# Patient Record
Sex: Female | Born: 1970
Health system: Southern US, Community
[De-identification: ages and names within clinical notes are randomized; demographics above are authoritative.]

## PROBLEM LIST (undated history)

## (undated) DIAGNOSIS — I10 Essential (primary) hypertension: Secondary | ICD-10-CM

## (undated) DIAGNOSIS — D219 Benign neoplasm of connective and other soft tissue, unspecified: Secondary | ICD-10-CM

## (undated) HISTORY — DX: Benign neoplasm of connective and other soft tissue, unspecified: D21.9

## (undated) HISTORY — PX: TUBAL LIGATION: SHX77

## (undated) HISTORY — DX: Essential (primary) hypertension: I10

---

## 2006-10-03 ENCOUNTER — Other Ambulatory Visit: Admission: RE | Admit: 2006-10-03 | Discharge: 2006-10-03 | Payer: Self-pay | Admitting: Obstetrics and Gynecology

## 2008-02-17 ENCOUNTER — Other Ambulatory Visit: Admission: RE | Admit: 2008-02-17 | Discharge: 2008-02-17 | Payer: Self-pay | Admitting: Obstetrics and Gynecology

## 2014-08-19 ENCOUNTER — Ambulatory Visit: Payer: Self-pay

## 2014-12-01 ENCOUNTER — Other Ambulatory Visit: Payer: Self-pay | Admitting: Family Medicine

## 2014-12-01 DIAGNOSIS — Z1231 Encounter for screening mammogram for malignant neoplasm of breast: Secondary | ICD-10-CM

## 2014-12-15 ENCOUNTER — Ambulatory Visit
Admission: RE | Admit: 2014-12-15 | Discharge: 2014-12-15 | Disposition: A | Discharge status: Home / Self Care | Payer: 59 | Source: Ambulatory Visit | Attending: Family Medicine | Admitting: Family Medicine

## 2014-12-15 DIAGNOSIS — Z1231 Encounter for screening mammogram for malignant neoplasm of breast: Secondary | ICD-10-CM

## 2015-11-22 ENCOUNTER — Other Ambulatory Visit: Payer: Self-pay | Admitting: Family Medicine

## 2015-11-22 DIAGNOSIS — Z1231 Encounter for screening mammogram for malignant neoplasm of breast: Secondary | ICD-10-CM

## 2015-12-16 ENCOUNTER — Ambulatory Visit: Payer: 59

## 2015-12-28 ENCOUNTER — Encounter: Payer: Self-pay | Admitting: Radiology

## 2015-12-28 ENCOUNTER — Ambulatory Visit
Admission: RE | Admit: 2015-12-28 | Discharge: 2015-12-28 | Disposition: A | Discharge status: Home / Self Care | Payer: 59 | Source: Ambulatory Visit | Attending: Family Medicine | Admitting: Family Medicine

## 2015-12-28 DIAGNOSIS — Z1231 Encounter for screening mammogram for malignant neoplasm of breast: Secondary | ICD-10-CM

## 2017-01-16 ENCOUNTER — Other Ambulatory Visit: Payer: Self-pay | Admitting: Family Medicine

## 2017-01-16 DIAGNOSIS — Z1231 Encounter for screening mammogram for malignant neoplasm of breast: Secondary | ICD-10-CM

## 2017-02-08 ENCOUNTER — Ambulatory Visit
Admission: RE | Admit: 2017-02-08 | Discharge: 2017-02-08 | Disposition: A | Discharge status: Home / Self Care | Payer: 59 | Source: Ambulatory Visit | Attending: Family Medicine | Admitting: Family Medicine

## 2017-02-08 DIAGNOSIS — Z1231 Encounter for screening mammogram for malignant neoplasm of breast: Secondary | ICD-10-CM

## 2017-02-22 ENCOUNTER — Ambulatory Visit: Payer: 59

## 2019-01-14 ENCOUNTER — Encounter (HOSPITAL_COMMUNITY): Payer: Self-pay | Admitting: *Deleted

## 2019-01-14 ENCOUNTER — Other Ambulatory Visit: Payer: Self-pay

## 2019-01-14 DIAGNOSIS — R0600 Dyspnea, unspecified: Secondary | ICD-10-CM | POA: Insufficient documentation

## 2019-01-14 DIAGNOSIS — U071 COVID-19: Secondary | ICD-10-CM | POA: Insufficient documentation

## 2019-01-14 DIAGNOSIS — R05 Cough: Secondary | ICD-10-CM | POA: Insufficient documentation

## 2019-01-14 NOTE — ED Triage Notes (Signed)
Pt states that she was diagnosed as having covid on 01/05/2019, reports that she continues to have sob, cough, fever, sore throat, loss of taste and smell, denies any n/v/d

## 2019-01-15 ENCOUNTER — Emergency Department (HOSPITAL_COMMUNITY)
Admission: EM | Admit: 2019-01-15 | Discharge: 2019-01-15 | Disposition: A | Discharge status: Home / Self Care | Payer: Self-pay | Attending: Emergency Medicine | Admitting: Emergency Medicine

## 2019-01-15 ENCOUNTER — Emergency Department (HOSPITAL_COMMUNITY): Payer: Self-pay

## 2019-01-15 DIAGNOSIS — U071 COVID-19: Secondary | ICD-10-CM

## 2019-01-15 LAB — COMPREHENSIVE METABOLIC PANEL
ALT: 19 U/L (ref 0–44)
AST: 20 U/L (ref 15–41)
Albumin: 3.4 g/dL — ABNORMAL LOW (ref 3.5–5.0)
Alkaline Phosphatase: 84 U/L (ref 38–126)
Anion gap: 7 (ref 5–15)
BUN: 9 mg/dL (ref 6–20)
CO2: 26 mmol/L (ref 22–32)
Calcium: 8 mg/dL — ABNORMAL LOW (ref 8.9–10.3)
Chloride: 102 mmol/L (ref 98–111)
Creatinine, Ser: 0.76 mg/dL (ref 0.44–1.00)
GFR calc Af Amer: >60 mL/min (ref >60)
GFR calc non Af Amer: >60 mL/min (ref >60)
Glucose, Bld: 105 mg/dL — ABNORMAL HIGH (ref 70–99)
Potassium: 3.7 mmol/L (ref 3.5–5.1)
Sodium: 135 mmol/L (ref 135–145)
Total Bilirubin: 0.6 mg/dL (ref 0.3–1.2)
Total Protein: 7.9 g/dL (ref 6.5–8.1)

## 2019-01-15 LAB — CBC WITH DIFFERENTIAL/PLATELET
Abs Immature Granulocytes: 0.01 10*3/uL (ref 0.00–0.07)
Basophils Absolute: 0 10*3/uL (ref 0.0–0.1)
Basophils Relative: 0 %
Eosinophils Absolute: 0 10*3/uL (ref 0.0–0.5)
Eosinophils Relative: 0 %
HCT: 41.6 % (ref 36.0–46.0)
Hemoglobin: 12.3 g/dL (ref 12.0–15.0)
Immature Granulocytes: 0 %
Lymphocytes Relative: 34 %
Lymphs Abs: 1.8 10*3/uL (ref 0.7–4.0)
MCH: 23.3 pg — ABNORMAL LOW (ref 26.0–34.0)
MCHC: 29.6 g/dL — ABNORMAL LOW (ref 30.0–36.0)
MCV: 78.6 fL — ABNORMAL LOW (ref 80.0–100.0)
Monocytes Absolute: 0.5 10*3/uL (ref 0.1–1.0)
Monocytes Relative: 10 %
Neutro Abs: 2.9 10*3/uL (ref 1.7–7.7)
Neutrophils Relative %: 56 %
Platelets: 305 10*3/uL (ref 150–400)
RBC: 5.29 MIL/uL — ABNORMAL HIGH (ref 3.87–5.11)
RDW: 17.2 % — ABNORMAL HIGH (ref 11.5–15.5)
WBC: 5.2 10*3/uL (ref 4.0–10.5)
nRBC: 0 % (ref 0.0–0.2)

## 2019-01-15 NOTE — ED Notes (Signed)
Ambulated pt on room air and o2 stayed at 92%

## 2019-01-15 NOTE — Discharge Instructions (Addendum)
Return if your breathing is getting worse/. 

## 2019-01-15 NOTE — ED Provider Notes (Signed)
[facility] EMERGENCY DEPARTMENT Provider Note   CSN: [id number] Arrival date & time: 01/14/19  2121    History   Chief Complaint Chief Complaint  Patient presents with   Cough    HPI Jeanne Guerrero is a 48 y.o. female.   The history is provided by the patient.  Cough She was diagnosed as Covid positive on November 23, symptoms started on November 22.  She has had a nonproductive cough, fever, chills.  She has lost her sense of smell and taste.  She initially had some diarrhea, but is no longer having diarrhea.  She denies nausea or vomiting.  She denies body aches.  She has noted that she is getting more dyspneic with dyspnea being mainly with moderate exertion.  She has been taking vitamin C, vitamin D, ibuprofen at home.  History reviewed. No pertinent past medical history.  There are no active problems to display for this patient.   Past Surgical History:  Procedure Laterality Date   CESAREAN SECTION       OB History   No obstetric history on file.      Home Medications    Prior to Admission medications   Not on File    Family History Family History  Problem Relation Age of Onset   Breast cancer Maternal Grandmother     Social History Social History   Tobacco Use   Smoking status: Former Smoker   Smokeless tobacco: Never Used  Substance Use Topics   Alcohol use: Not Currently   Drug use: Not on file     Allergies   Moxifloxacin   Review of Systems Review of Systems  Respiratory: Positive for cough.   All other systems reviewed and are negative.    Physical Exam Updated Vital Signs BP (!) 147/104    Pulse (!) 102    Temp (!) 100.4 F (38 C) (Oral)    Resp 20    Ht 5' 8.5" (1.74 m)    Wt 124.5 kg    LMP 12/31/2018    SpO2 96%    BMI 41.12 kg/m   Physical Exam Vitals signs and nursing note reviewed.    47 year old female, resting comfortably and in no acute distress. Vital signs are significant for elevated temperature,  heart rate, blood pressure. Oxygen saturation is 92%, which is normal. Head is normocephalic and atraumatic. PERRLA, EOMI. Oropharynx is clear. Neck is nontender and supple without adenopathy or JVD. Back is nontender and there is no CVA tenderness. Lungs are clear without rales, wheezes, or rhonchi. Chest is nontender. Heart has regular rate and rhythm without murmur. Abdomen is soft, flat, nontender without masses or hepatosplenomegaly and peristalsis is normoactive. Extremities have no cyanosis or edema, full range of motion is present. Skin is warm and dry without rash. Neurologic: Mental status is normal, cranial nerves are intact, there are no motor or sensory deficits.  ED Treatments / Results  Labs (all labs ordered are listed, but only abnormal results are displayed) Labs Reviewed  CBC WITH DIFFERENTIAL/PLATELET - Abnormal; Notable for the following components:      Result Value   RBC 5.29 (*)    MCV 78.6 (*)    MCH 23.3 (*)    MCHC 29.6 (*)    RDW 17.2 (*)    All other components within normal limits  COMPREHENSIVE METABOLIC PANEL - Abnormal; Notable for the following components:   Glucose, Bld 105 (*)    Calcium 8.0 (*)    Albumin  3.4 (*)    All other components within normal limits    Radiology Dg Chest Port 1 View  Result Date: 01/15/2019 CLINICAL DATA:  Fever and covid positive EXAM: PORTABLE CHEST 1 VIEW COMPARISON:  None. FINDINGS: The heart size and mediastinal contours are within normal limits. Patchy airspace opacities seen at both lower lungs. There is overall shallow degree of aeration. No pleural effusion. No acute osseous abnormality. IMPRESSION: Patchy airspace opacities seen at both lower lungs. The findings in the lungs are nonspecific, but concerning for atypical infection, which includes viral pneumonia (COVID-19). Electronically Signed   By: Prudencio Pair M.D.   On: 01/15/2019 01:15    Procedures Procedures  Medications Ordered in ED Medications -  No data to display   Initial Impression / Assessment and Plan / ED Course  I have reviewed the triage vital signs and the nursing notes.  Pertinent labs & imaging results that were available during my care of the patient were reviewed by me and considered in my medical decision making (see chart for details).  COVID-19 infection with persistent fever and worsening dyspnea.  Will check chest x-ray, screening labs.  Pulse oximetry on room air is normal, will check pulse oximetry with ambulation.  Old records are reviewed, and she has no relevant past visits.  Labs are unremarkable.  Chest x-ray does show some minimal bilateral infiltrates.  On ambulation, patient maintain adequate oxygen saturation.  No indication for hospital admission at this point.  This was explained to the patient.  She is discharged to continue symptomatic treatment at home, but advised to return if dyspnea seems to be worsening.  [name] was evaluated in Emergency Department on 01/15/2019 for the symptoms described in the history of present illness. She was evaluated in the context of the global COVID-19 pandemic, which necessitated consideration that the patient might be at risk for infection with the SARS-CoV-2 virus that causes COVID-19. Institutional protocols and algorithms that pertain to the evaluation of patients at risk for COVID-19 are in a state of rapid change based on information released by regulatory bodies including the CDC and federal and state organizations. These policies and algorithms were followed during the patient's care in the ED.  Final Clinical Impressions(s) / ED Diagnoses   Final diagnoses:  COVID-19 virus infection    ED Discharge Orders    None       Delora Fuel, MD [id number] [phone]

## 2020-04-05 ENCOUNTER — Other Ambulatory Visit: Payer: Self-pay | Admitting: Urology

## 2020-04-05 DIAGNOSIS — Z0000 Encounter for general adult medical examination without abnormal findings: Secondary | ICD-10-CM

## 2020-05-13 ENCOUNTER — Other Ambulatory Visit: Payer: Self-pay

## 2020-05-13 ENCOUNTER — Ambulatory Visit (INDEPENDENT_AMBULATORY_CARE_PROVIDER_SITE_OTHER): Payer: BC Managed Care – PPO | Admitting: Obstetrics & Gynecology

## 2020-05-13 ENCOUNTER — Other Ambulatory Visit (HOSPITAL_COMMUNITY)
Admission: RE | Admit: 2020-05-13 | Discharge: 2020-05-13 | Disposition: A | Discharge status: Home / Self Care | Payer: BC Managed Care – PPO | Source: Ambulatory Visit | Attending: Obstetrics & Gynecology | Admitting: Obstetrics & Gynecology

## 2020-05-13 ENCOUNTER — Encounter: Payer: Self-pay | Admitting: Obstetrics & Gynecology

## 2020-05-13 VITALS — BP 158/93 | HR 94 | Ht 68.5 in | Wt 284.0 lb

## 2020-05-13 DIAGNOSIS — N920 Excessive and frequent menstruation with regular cycle: Secondary | ICD-10-CM

## 2020-05-13 DIAGNOSIS — Z01419 Encounter for gynecological examination (general) (routine) without abnormal findings: Secondary | ICD-10-CM | POA: Diagnosis not present

## 2020-05-13 DIAGNOSIS — D219 Benign neoplasm of connective and other soft tissue, unspecified: Secondary | ICD-10-CM | POA: Diagnosis not present

## 2020-05-13 DIAGNOSIS — Z1211 Encounter for screening for malignant neoplasm of colon: Secondary | ICD-10-CM

## 2020-05-13 DIAGNOSIS — N946 Dysmenorrhea, unspecified: Secondary | ICD-10-CM | POA: Diagnosis not present

## 2020-05-13 DIAGNOSIS — Z1212 Encounter for screening for malignant neoplasm of rectum: Secondary | ICD-10-CM | POA: Diagnosis not present

## 2020-05-13 MED ORDER — MYFEMBREE 40-1-0.5 MG PO TABS: 1.0000 | ORAL_TABLET | Freq: Every day | ORAL | 11 refills | Status: DC

## 2020-05-13 NOTE — Progress Notes (Signed)
Subjective:     Jeanne Guerrero is a 50 y.o. female here for a routine exam.  Patient's last menstrual period was 04/14/2020. [id number] Birth Control Method:  BTL Menstrual Calendar(currently): irregular cycles  Current complaints: heavy painful periods.   Current acute medical issues:  none   Recent Gynecologic History Patient's last menstrual period was 04/14/2020. Last Pap: 2019,  normal Last mammogram: 2018,  normal  Past Medical History:  Diagnosis Date  . Fibroids   . Hypertension     Past Surgical History:  Procedure Laterality Date  . CESAREAN SECTION    . TUBAL LIGATION      OB History    Gravida  2   Para  2   Term  2   Preterm      AB      Living  2     SAB      IAB      Ectopic      Multiple      Live Births              Social History   Socioeconomic History  . Marital status: Divorced    Spouse name: Not on file  . Number of children: Not on file  . Years of education: Not on file  . Highest education level: Not on file  Occupational History  . Not on file  Tobacco Use  . Smoking status: Former Smoker    Types: Cigarettes  . Smokeless tobacco: Never Used  Vaping Use  . Vaping Use: Never used  Substance and Sexual Activity  . Alcohol use: Not Currently  . Drug use: Never  . Sexual activity: Yes    Birth control/protection: Surgical  Other Topics Concern  . Not on file  Social History Narrative  . Not on file   Social Determinants of Health   Financial Resource Strain: Low Risk   . Difficulty of Paying Living Expenses: Not very hard  Food Insecurity: No Food Insecurity  . Worried About Charity fundraiser in the Last Year: Never true  . Ran Out of Food in the Last Year: Never true  Transportation Needs: No Transportation Needs  . Lack of Transportation (Medical): No  . Lack of Transportation (Non-Medical): No  Physical Activity: Insufficiently Active  . Days of Exercise per Week: 2 days  . Minutes of Exercise per  Session: 30 min  Stress: No Stress Concern Present  . Feeling of Stress : Only a little  Social Connections: Socially Isolated  . Frequency of Communication with Friends and Family: More than three times a week  . Frequency of Social Gatherings with Friends and Family: Once a week  . Attends Religious Services: Never  . Active Member of Clubs or Organizations: No  . Attends Archivist Meetings: Never  . Marital Status: Divorced    Family History  Problem Relation Age of Onset  . Breast cancer Maternal Grandmother      Current Outpatient Medications:  .  chlorthalidone (HYGROTON) 25 MG tablet, Take 25 mg by mouth daily., Disp: , Rfl:  .  losartan (COZAAR) 25 MG tablet, Take 25 mg by mouth daily., Disp: , Rfl:  .  Relugolix-Estradiol-Norethind (MYFEMBREE) 40-1-0.5 MG TABS, Take 1 tablet by mouth daily., Disp: 30 tablet, Rfl: 11 .  VITAMIN D, CHOLECALCIFEROL, PO, Take 5,000 Units by mouth., Disp: , Rfl:   Review of Systems  Review of Systems  Constitutional: Negative for fever, chills, weight loss, malaise/fatigue and diaphoresis.  HENT: Negative for hearing loss, ear pain, nosebleeds, congestion, sore throat, neck pain, tinnitus and ear discharge.   Eyes: Negative for blurred vision, double vision, photophobia, pain, discharge and redness.  Respiratory: Negative for cough, hemoptysis, sputum production, shortness of breath, wheezing and stridor.   Cardiovascular: Negative for chest pain, palpitations, orthopnea, claudication, leg swelling and PND.  Gastrointestinal: negative for abdominal pain. Negative for heartburn, nausea, vomiting, diarrhea, constipation, blood in stool and melena.  Genitourinary: Negative for dysuria, urgency, frequency, hematuria and flank pain.  Musculoskeletal: Negative for myalgias, back pain, joint pain and falls.  Skin: Negative for itching and rash.  Neurological: Negative for dizziness, tingling, tremors, sensory change, speech change, focal  weakness, seizures, loss of consciousness, weakness and headaches.  Endo/Heme/Allergies: Negative for environmental allergies and polydipsia. Does not bruise/bleed easily.  Psychiatric/Behavioral: Negative for depression, suicidal ideas, hallucinations, memory loss and substance abuse. The patient is not nervous/anxious and does not have insomnia.        Objective:  Blood pressure (!) 158/93, pulse 94, height 5' 8.5" (1.74 m), weight 284 lb (128.8 kg), last menstrual period 04/14/2020.   Physical Exam  Vitals reviewed. Constitutional: She is oriented to person, place, and time. She appears well-developed and well-nourished.  HENT:  Head: Normocephalic and atraumatic.        Right Ear: External ear normal.  Left Ear: External ear normal.  Nose: Nose normal.  Mouth/Throat: Oropharynx is clear and moist.  Eyes: Conjunctivae and EOM are normal. Pupils are equal, round, and reactive to light. Right eye exhibits no discharge. Left eye exhibits no discharge. No scleral icterus.  Neck: Normal range of motion. Neck supple. No tracheal deviation present. No thyromegaly present.  Cardiovascular: Normal rate, regular rhythm, normal heart sounds and intact distal pulses.  Exam reveals no gallop and no friction rub.   No murmur heard. Respiratory: Effort normal and breath sounds normal. No respiratory distress. She has no wheezes. She has no rales. She exhibits no tenderness.  GI: Soft. Bowel sounds are normal. She exhibits no distension and no mass. There is no tenderness. There is no rebound and no guarding.  Genitourinary:  Breasts no masses skin changes or nipple changes bilaterally      Vulva is normal without lesions Vagina is pink moist without discharge Cervix normal in appearance and pap is done Uterus is 12 weeks size Adnexa is negative with normal sized ovaries  {Rectal    hemoccult negative, normal tone, no masses  Musculoskeletal: Normal range of motion. She exhibits no edema and no  tenderness.  Neurological: She is alert and oriented to person, place, and time. She has normal reflexes. She displays normal reflexes. No cranial nerve deficit. She exhibits normal muscle tone. Coordination normal.  Skin: Skin is warm and dry. No rash noted. No erythema. No pallor.  Psychiatric: She has a normal mood and affect. Her behavior is normal. Judgment and thought content normal.       Medications Ordered at today's visit: Meds ordered this encounter  Medications  . Relugolix-Estradiol-Norethind (MYFEMBREE) 40-1-0.5 MG TABS    Sig: Take 1 tablet by mouth daily.    Dispense:  30 tablet    Refill:  11    Other orders placed at today's visit: No orders of the defined types were placed in this encounter.     Assessment:    Normal Gyn exam.      ICD-10-CM   1. Well woman exam with routine gynecological exam  Z01.419   2.  Encounter for gynecological examination with Papanicolaou smear of cervix  Z01.419 Cytology - PAP( [facility])  3. Fibroids, 12 week size on exam  D21.9   4. Menorrhagia with regular cycle  N92.0   5. Dysmenorrhea  N94.6   6. Screening for colorectal cancer  Z12.11    Z12.12     Plan:    Contraception: tubal ligation. Mammogram ordered. Follow up in: 6 weeks. Begin myfembree, sonogram 6 weeks to evaluate fibroids and manage cycles     Return in about 6 weeks (around 06/24/2020) for GYN sono, Follow up, with Dr Elonda Husky.

## 2020-05-16 ENCOUNTER — Other Ambulatory Visit: Payer: Self-pay | Admitting: *Deleted

## 2020-05-16 MED ORDER — MYFEMBREE 40-1-0.5 MG PO TABS: 1.0000 | ORAL_TABLET | Freq: Every day | ORAL | 11 refills | Status: DC

## 2020-05-23 LAB — CYTOLOGY - PAP
Adequacy: ABSENT
Chlamydia: NEGATIVE
Comment: NEGATIVE
Comment: NEGATIVE
Comment: NEGATIVE
Comment: NORMAL
Diagnosis: NEGATIVE
Diagnosis: REACTIVE
HPV 16: NEGATIVE
HPV 18 / 45: NEGATIVE
High risk HPV: POSITIVE — AB
Neisseria Gonorrhea: NEGATIVE

## 2020-05-25 ENCOUNTER — Telehealth: Payer: Self-pay | Admitting: *Deleted

## 2020-05-25 NOTE — Telephone Encounter (Signed)
Patient informed per Dr Elonda Husky she has +HPV(not 16/18) with a normal cytology.  Needs repeat in 1 year.  Pt verbalized understanding with no further questions.

## 2020-05-27 ENCOUNTER — Ambulatory Visit
Admission: RE | Admit: 2020-05-27 | Discharge: 2020-05-27 | Disposition: A | Discharge status: Home / Self Care | Payer: BC Managed Care – PPO | Source: Ambulatory Visit | Attending: Urology | Admitting: Urology

## 2020-05-27 ENCOUNTER — Other Ambulatory Visit: Payer: Self-pay

## 2020-05-27 DIAGNOSIS — Z0000 Encounter for general adult medical examination without abnormal findings: Secondary | ICD-10-CM

## 2020-05-27 DIAGNOSIS — Z1231 Encounter for screening mammogram for malignant neoplasm of breast: Secondary | ICD-10-CM | POA: Diagnosis not present

## 2020-06-08 ENCOUNTER — Telehealth: Payer: Self-pay | Admitting: *Deleted

## 2020-06-08 NOTE — Telephone Encounter (Signed)
I received a paper from [employer] stating pt's Myfembree was covered with a $45.00 copay. Deductible does not apply. Once out of pocket cost has been met, member is eligible at 100% of allowance. I called pt to give her this info and pt states she received the med with a $0 copay. Pt was advised hopefully she would continue to get med at no cost. JSY

## 2020-06-27 ENCOUNTER — Other Ambulatory Visit: Payer: BC Managed Care – PPO

## 2020-07-01 ENCOUNTER — Other Ambulatory Visit: Payer: BC Managed Care – PPO

## 2020-07-01 ENCOUNTER — Ambulatory Visit: Payer: BC Managed Care – PPO | Admitting: Obstetrics & Gynecology

## 2020-07-06 DIAGNOSIS — L732 Hidradenitis suppurativa: Secondary | ICD-10-CM | POA: Diagnosis not present

## 2020-07-06 DIAGNOSIS — Z5181 Encounter for therapeutic drug level monitoring: Secondary | ICD-10-CM | POA: Diagnosis not present

## 2020-07-06 DIAGNOSIS — Z1159 Encounter for screening for other viral diseases: Secondary | ICD-10-CM | POA: Diagnosis not present

## 2020-07-06 DIAGNOSIS — Z111 Encounter for screening for respiratory tuberculosis: Secondary | ICD-10-CM | POA: Diagnosis not present

## 2020-07-06 DIAGNOSIS — Z79899 Other long term (current) drug therapy: Secondary | ICD-10-CM | POA: Diagnosis not present

## 2020-07-13 DIAGNOSIS — Z131 Encounter for screening for diabetes mellitus: Secondary | ICD-10-CM | POA: Diagnosis not present

## 2020-07-13 DIAGNOSIS — I10 Essential (primary) hypertension: Secondary | ICD-10-CM | POA: Diagnosis not present

## 2020-07-13 DIAGNOSIS — Z6841 Body Mass Index (BMI) 40.0 and over, adult: Secondary | ICD-10-CM | POA: Diagnosis not present

## 2020-08-10 ENCOUNTER — Other Ambulatory Visit: Payer: Self-pay | Admitting: Obstetrics & Gynecology

## 2020-08-10 DIAGNOSIS — N852 Hypertrophy of uterus: Secondary | ICD-10-CM

## 2020-08-12 ENCOUNTER — Encounter: Payer: Self-pay | Admitting: Obstetrics & Gynecology

## 2020-08-12 ENCOUNTER — Ambulatory Visit (INDEPENDENT_AMBULATORY_CARE_PROVIDER_SITE_OTHER): Payer: BC Managed Care – PPO | Admitting: Obstetrics & Gynecology

## 2020-08-12 ENCOUNTER — Ambulatory Visit (INDEPENDENT_AMBULATORY_CARE_PROVIDER_SITE_OTHER): Payer: BC Managed Care – PPO

## 2020-08-12 ENCOUNTER — Other Ambulatory Visit: Payer: Self-pay | Admitting: Obstetrics & Gynecology

## 2020-08-12 ENCOUNTER — Other Ambulatory Visit: Payer: Self-pay

## 2020-08-12 VITALS — BP 135/90 | HR 79 | Ht 68.5 in | Wt 284.0 lb

## 2020-08-12 DIAGNOSIS — N852 Hypertrophy of uterus: Secondary | ICD-10-CM

## 2020-08-12 DIAGNOSIS — D219 Benign neoplasm of connective and other soft tissue, unspecified: Secondary | ICD-10-CM

## 2020-08-12 NOTE — Progress Notes (Addendum)
PELVIC US TA only,enlarged heterogeneous anteverted uterus with mult fibroids,(#1) posterior submucosal vs subserosal fibroid 14.5 x 12 x 14.7 cm,(#2) anterior subserosal fibroid 2.9 x 1.7 x 2.8 cm,unable to visualize endometrium,normal ovaries,no free fluid

## 2020-08-12 NOTE — Progress Notes (Signed)
Follow up appointment for results  Chief Complaint  Patient presents with   discuss [place] results    Blood pressure 135/90, pulse 79, height 5' 8.5" (1.74 m), weight 284 lb (128.8 kg), last menstrual period 07/11/2020.  GYNECOLOGIC SONOGRAM     Jeanne Guerrero is a 50 y.o. [id number] LMP 07/11/2020,She is here for a pelvic sonogram for enlarged uterus.   Uterus                      18 x 11.4 x 15.3 cm, Total uterine volume 1644 cc,enlarged heterogeneous anteverted uterus with mult fibroids,(#1) posterior submucosal vs subserosal fibroid 14.5 x 12 x 14.7 cm,(#2) anterior subserosal fibroid 2.9 x 1.7 x 2.8 cm   Endometrium          Unable to visualize endometrium   Right ovary             2.6 x 2.2 x 2.4 cm, wnl   Left ovary                3.5 x 1.8 x 2.3 cm, wnl   No free fluid   Technician Comments:   PELVIC US TA only,enlarged heterogeneous anteverted uterus with mult fibroids,(#1) posterior submucosal vs subserosal fibroid 14.5 x 12 x 14.7 cm,(#2) anterior subserosal fibroid 2.9 x 1.7 x 2.8 cm,unable to visualize endometrium,normal ovaries,no free fluid     Silver Huguenin 08/12/2020 1:51 PM     Clinical Impression and recommendations:   I have reviewed the sonogram results above, combined with the patient's current clinical course, below are my impressions and any appropriate recommendations for management based on the sonographic findings.   Uterus is significantly enlarged due to fibroids, one of which appears to be submucosal Endometrium is not visualized, patient is on myfembree Both ovaries are normal size shape and contour     Florian Buff 08/15/2020 10:54 AM      MEDS ordered this encounter: No orders of the defined types were placed in this encounter.   Orders for this encounter: No orders of the defined types were placed in this encounter.   Impression: 1. Enlarged uterus As above - US Pelvis Complete   Plan: Continue myfembree, she is doing great on  the therapy, little to no bleeding  Follow Up: Return for Follow up, with Dr Elonda Husky.     All questions were answered.  Past Medical History:  Diagnosis Date   Fibroids    Hypertension     Past Surgical History:  Procedure Laterality Date   CESAREAN SECTION     TUBAL LIGATION      OB History     Gravida  2   Para  2   Term  2   Preterm      AB      Living  2      SAB      IAB      Ectopic      Multiple      Live Births              Allergies  Allergen Reactions   Moxifloxacin Nausea Only    Stomach pain    Social History   Socioeconomic History   Marital status: Divorced    Spouse name: Not on file   Number of children: Not on file   Years of education: Not on file   Highest education level: Not on file  Occupational History   Not on file  Tobacco Use   Smoking status: Former    Types: Cigarettes   Smokeless tobacco: Never  Vaping Use   Vaping Use: Never used  Substance and Sexual Activity   Alcohol use: Not Currently   Drug use: Never   Sexual activity: Yes    Birth control/protection: Surgical    Comment: tubal  Other Topics Concern   Not on file  Social History Narrative   Not on file   Social Determinants of Health   Financial Resource Strain: Low Risk    Difficulty of Paying Living Expenses: Not very hard  Food Insecurity: No Food Insecurity   Worried About Charity fundraiser in the Last Year: Never true   Ran Out of Food in the Last Year: Never true  Transportation Needs: No Transportation Needs   Lack of Transportation (Medical): No   Lack of Transportation (Non-Medical): No  Physical Activity: Insufficiently Active   Days of Exercise per Week: 2 days   Minutes of Exercise per Session: 30 min  Stress: No Stress Concern Present   Feeling of Stress : Only a little  Social Connections: Socially Isolated   Frequency of Communication with Friends and Family: More than three times a week   Frequency of Social  Gatherings with Friends and Family: Once a week   Attends Religious Services: Never   Marine scientist or Organizations: No   Attends Archivist Meetings: Never   Marital Status: Divorced    Family History  Problem Relation Age of Onset   Breast cancer Maternal Grandmother

## 2020-09-29 DIAGNOSIS — Z20828 Contact with and (suspected) exposure to other viral communicable diseases: Secondary | ICD-10-CM | POA: Diagnosis not present

## 2020-09-29 DIAGNOSIS — R059 Cough, unspecified: Secondary | ICD-10-CM | POA: Diagnosis not present

## 2020-10-03 DIAGNOSIS — Z20828 Contact with and (suspected) exposure to other viral communicable diseases: Secondary | ICD-10-CM | POA: Diagnosis not present

## 2020-10-04 DIAGNOSIS — Z20828 Contact with and (suspected) exposure to other viral communicable diseases: Secondary | ICD-10-CM | POA: Diagnosis not present

## 2020-10-05 DIAGNOSIS — H1033 Unspecified acute conjunctivitis, bilateral: Secondary | ICD-10-CM | POA: Diagnosis not present

## 2020-10-05 DIAGNOSIS — J019 Acute sinusitis, unspecified: Secondary | ICD-10-CM | POA: Diagnosis not present

## 2020-10-18 DIAGNOSIS — H10029 Other mucopurulent conjunctivitis, unspecified eye: Secondary | ICD-10-CM | POA: Diagnosis not present

## 2020-10-18 DIAGNOSIS — J209 Acute bronchitis, unspecified: Secondary | ICD-10-CM | POA: Diagnosis not present

## 2020-10-18 DIAGNOSIS — I10 Essential (primary) hypertension: Secondary | ICD-10-CM | POA: Diagnosis not present

## 2020-10-18 DIAGNOSIS — J019 Acute sinusitis, unspecified: Secondary | ICD-10-CM | POA: Diagnosis not present

## 2020-11-09 DIAGNOSIS — L732 Hidradenitis suppurativa: Secondary | ICD-10-CM | POA: Diagnosis not present

## 2020-11-09 DIAGNOSIS — Z79899 Other long term (current) drug therapy: Secondary | ICD-10-CM | POA: Diagnosis not present

## 2021-01-17 DIAGNOSIS — Z6841 Body Mass Index (BMI) 40.0 and over, adult: Secondary | ICD-10-CM | POA: Diagnosis not present

## 2021-01-17 DIAGNOSIS — E559 Vitamin D deficiency, unspecified: Secondary | ICD-10-CM | POA: Diagnosis not present

## 2021-01-17 DIAGNOSIS — E785 Hyperlipidemia, unspecified: Secondary | ICD-10-CM | POA: Diagnosis not present

## 2021-01-17 DIAGNOSIS — M2550 Pain in unspecified joint: Secondary | ICD-10-CM | POA: Diagnosis not present

## 2021-01-17 DIAGNOSIS — I10 Essential (primary) hypertension: Secondary | ICD-10-CM | POA: Diagnosis not present

## 2021-02-09 ENCOUNTER — Encounter: Payer: Self-pay | Admitting: *Deleted

## 2021-02-14 ENCOUNTER — Encounter: Payer: Self-pay | Admitting: Obstetrics & Gynecology

## 2021-02-23 ENCOUNTER — Ambulatory Visit: Payer: BC Managed Care – PPO | Admitting: Obstetrics & Gynecology

## 2021-02-23 DIAGNOSIS — L732 Hidradenitis suppurativa: Secondary | ICD-10-CM | POA: Diagnosis not present

## 2021-02-27 ENCOUNTER — Ambulatory Visit: Payer: BC Managed Care – PPO | Admitting: Obstetrics & Gynecology

## 2021-03-01 DIAGNOSIS — M13861 Other specified arthritis, right knee: Secondary | ICD-10-CM | POA: Diagnosis not present

## 2021-03-01 DIAGNOSIS — M25561 Pain in right knee: Secondary | ICD-10-CM | POA: Diagnosis not present

## 2021-04-20 ENCOUNTER — Ambulatory Visit: Payer: BC Managed Care – PPO | Admitting: Obstetrics & Gynecology

## 2021-05-25 DIAGNOSIS — L732 Hidradenitis suppurativa: Secondary | ICD-10-CM | POA: Diagnosis not present

## 2021-05-25 DIAGNOSIS — Z79899 Other long term (current) drug therapy: Secondary | ICD-10-CM | POA: Diagnosis not present

## 2021-05-25 DIAGNOSIS — Z5181 Encounter for therapeutic drug level monitoring: Secondary | ICD-10-CM | POA: Diagnosis not present

## 2021-06-05 DIAGNOSIS — I10 Essential (primary) hypertension: Secondary | ICD-10-CM | POA: Diagnosis not present

## 2021-06-05 DIAGNOSIS — J019 Acute sinusitis, unspecified: Secondary | ICD-10-CM | POA: Diagnosis not present

## 2021-06-09 ENCOUNTER — Encounter: Payer: Self-pay | Admitting: Obstetrics & Gynecology

## 2021-06-09 ENCOUNTER — Other Ambulatory Visit: Payer: Self-pay | Admitting: *Deleted

## 2021-06-11 MED ORDER — MYFEMBREE 40-1-0.5 MG PO TABS: 1.0000 | ORAL_TABLET | Freq: Every day | ORAL | 11 refills | Status: DC

## 2021-06-14 ENCOUNTER — Other Ambulatory Visit: Payer: Self-pay | Admitting: Obstetrics & Gynecology

## 2021-06-14 DIAGNOSIS — Z1231 Encounter for screening mammogram for malignant neoplasm of breast: Secondary | ICD-10-CM

## 2021-06-29 ENCOUNTER — Other Ambulatory Visit: Payer: Self-pay | Admitting: Obstetrics & Gynecology

## 2021-06-29 DIAGNOSIS — Z1231 Encounter for screening mammogram for malignant neoplasm of breast: Secondary | ICD-10-CM

## 2021-07-04 ENCOUNTER — Ambulatory Visit: Payer: BC Managed Care – PPO

## 2021-07-12 DIAGNOSIS — Z1231 Encounter for screening mammogram for malignant neoplasm of breast: Secondary | ICD-10-CM

## 2021-07-21 ENCOUNTER — Telehealth: Payer: BC Managed Care – PPO | Admitting: Obstetrics & Gynecology

## 2021-07-21 DIAGNOSIS — N946 Dysmenorrhea, unspecified: Secondary | ICD-10-CM | POA: Diagnosis not present

## 2021-07-21 DIAGNOSIS — N852 Hypertrophy of uterus: Secondary | ICD-10-CM

## 2021-07-21 DIAGNOSIS — N920 Excessive and frequent menstruation with regular cycle: Secondary | ICD-10-CM | POA: Diagnosis not present

## 2021-07-25 ENCOUNTER — Ambulatory Visit
Admission: RE | Admit: 2021-07-25 | Discharge: 2021-07-25 | Disposition: A | Discharge status: Home / Self Care | Payer: BC Managed Care – PPO | Source: Ambulatory Visit | Attending: Obstetrics & Gynecology | Admitting: Obstetrics & Gynecology

## 2021-07-25 DIAGNOSIS — Z1231 Encounter for screening mammogram for malignant neoplasm of breast: Secondary | ICD-10-CM

## 2021-08-23 ENCOUNTER — Encounter: Payer: Self-pay | Admitting: Obstetrics & Gynecology

## 2021-08-23 ENCOUNTER — Other Ambulatory Visit: Payer: Self-pay | Admitting: *Deleted

## 2021-08-23 NOTE — Progress Notes (Signed)
Erroneous encounter

## 2021-08-24 MED ORDER — MYFEMBREE 40-1-0.5 MG PO TABS: 1.0000 | ORAL_TABLET | Freq: Every day | ORAL | 11 refills | Status: DC

## 2021-09-29 NOTE — Progress Notes (Addendum)
   TELEHEALTH GYNECOLOGY VISIT ENCOUNTER NOTE  Provider location: [facility] [place] [facility]   Patient location: Home  I connected with Jeanne Guerrero on 07/21/21 at  8:50 AM EDT by telephone and verified that I am speaking with the correct person using two identifiers. Patient was unable to do MyChart audiovisual encounter due to technical difficulties, she tried several times.    I discussed the limitations, risks, security and privacy concerns of performing an evaluation and management service by telephone and the availability of in person appointments. I also discussed with the patient that there may be a patient responsible charge related to this service. The patient expressed understanding and agreed to proceed.   History:  Jeanne Guerrero is a 51 y.o. [id number] female being evaluated today for repsonse to myfembree. She denies any abnormal vaginal discharge, bleeding, pelvic pain or other concerns.       Past Medical History:  Diagnosis Date   Fibroids    Hypertension    Past Surgical History:  Procedure Laterality Date   CESAREAN SECTION     TUBAL LIGATION     The following portions of the patient's history were reviewed and updated as appropriate: allergies, current medications, past family history, past medical history, past social history, past surgical history and problem list.   Health Maintenance:  Normal pap and negative HRHPV on 4/22.  Normal mammogram on 6/23.   Review of Systems:  Pertinent items noted in HPI and remainder of comprehensive ROS otherwise negative.  Physical Exam:   General:  Alert, oriented and cooperative.   Mental Status: Normal mood and affect perceived. Normal judgment and thought content.  Physical exam deferred due to nature of the encounter  Labs and Imaging No results found for this or any previous visit (from the past 336 hour(s)). No results found.    Assessment and Plan:       ICD-10-CM   1. Enlarged uterus   N85.2     2. Menorrhagia with regular cycle  N92.0    positive repsonse to myfembree    3. Dysmenorrhea  N94.6    positive repsonse to myfembree           I discussed the assessment and treatment plan with the patient. The patient was provided an opportunity to ask questions and all were answered. The patient agreed with the plan and demonstrated an understanding of the instructions.   The patient was advised to call back or seek an in-person evaluation/go to the ED if the symptoms worsen or if the condition fails to improve as anticipated.  I provided 10 minutes of non-face-to-face time during this encounter.   Florian Buff, MD [facility], [place]

## 2021-10-17 DIAGNOSIS — I10 Essential (primary) hypertension: Secondary | ICD-10-CM | POA: Diagnosis not present

## 2021-10-17 DIAGNOSIS — R600 Localized edema: Secondary | ICD-10-CM | POA: Diagnosis not present

## 2021-10-17 DIAGNOSIS — R5383 Other fatigue: Secondary | ICD-10-CM | POA: Diagnosis not present

## 2021-10-17 DIAGNOSIS — Z8619 Personal history of other infectious and parasitic diseases: Secondary | ICD-10-CM | POA: Diagnosis not present

## 2021-11-23 DIAGNOSIS — L732 Hidradenitis suppurativa: Secondary | ICD-10-CM | POA: Diagnosis not present

## 2021-11-23 DIAGNOSIS — I10 Essential (primary) hypertension: Secondary | ICD-10-CM | POA: Diagnosis not present

## 2021-11-23 DIAGNOSIS — E669 Obesity, unspecified: Secondary | ICD-10-CM | POA: Diagnosis not present

## 2021-11-23 DIAGNOSIS — M150 Primary generalized (osteo)arthritis: Secondary | ICD-10-CM | POA: Diagnosis not present

## 2021-11-30 ENCOUNTER — Encounter: Payer: Self-pay | Admitting: *Deleted

## 2022-02-26 DIAGNOSIS — M150 Primary generalized (osteo)arthritis: Secondary | ICD-10-CM | POA: Diagnosis not present

## 2022-02-26 DIAGNOSIS — I10 Essential (primary) hypertension: Secondary | ICD-10-CM | POA: Diagnosis not present

## 2022-02-26 DIAGNOSIS — E669 Obesity, unspecified: Secondary | ICD-10-CM | POA: Diagnosis not present

## 2022-02-26 DIAGNOSIS — Z6841 Body Mass Index (BMI) 40.0 and over, adult: Secondary | ICD-10-CM | POA: Diagnosis not present

## 2022-02-26 DIAGNOSIS — L732 Hidradenitis suppurativa: Secondary | ICD-10-CM | POA: Diagnosis not present

## 2022-02-26 DIAGNOSIS — Z0000 Encounter for general adult medical examination without abnormal findings: Secondary | ICD-10-CM | POA: Diagnosis not present

## 2022-05-14 ENCOUNTER — Encounter: Payer: Self-pay | Admitting: *Deleted

## 2022-05-28 DIAGNOSIS — M150 Primary generalized (osteo)arthritis: Secondary | ICD-10-CM | POA: Diagnosis not present

## 2022-05-28 DIAGNOSIS — N182 Chronic kidney disease, stage 2 (mild): Secondary | ICD-10-CM | POA: Diagnosis not present

## 2022-05-28 DIAGNOSIS — I10 Essential (primary) hypertension: Secondary | ICD-10-CM | POA: Diagnosis not present

## 2022-06-29 ENCOUNTER — Encounter: Payer: Self-pay | Admitting: Obstetrics & Gynecology

## 2022-07-02 ENCOUNTER — Other Ambulatory Visit: Payer: Self-pay | Admitting: *Deleted

## 2022-07-03 MED ORDER — MYFEMBREE 40-1-0.5 MG PO TABS: 1.0000 | ORAL_TABLET | Freq: Every day | ORAL | 11 refills | Status: DC

## 2022-07-23 ENCOUNTER — Other Ambulatory Visit: Payer: Self-pay | Admitting: *Deleted

## 2022-07-23 MED ORDER — MYFEMBREE 40-1-0.5 MG PO TABS: 1.0000 | ORAL_TABLET | Freq: Every day | ORAL | 11 refills | Status: DC

## 2022-08-27 DIAGNOSIS — M150 Primary generalized (osteo)arthritis: Secondary | ICD-10-CM | POA: Diagnosis not present

## 2022-08-27 DIAGNOSIS — I10 Essential (primary) hypertension: Secondary | ICD-10-CM | POA: Diagnosis not present

## 2022-08-27 DIAGNOSIS — N182 Chronic kidney disease, stage 2 (mild): Secondary | ICD-10-CM | POA: Diagnosis not present

## 2022-09-20 ENCOUNTER — Other Ambulatory Visit: Payer: Self-pay | Admitting: Internal Medicine

## 2022-09-20 DIAGNOSIS — Z1231 Encounter for screening mammogram for malignant neoplasm of breast: Secondary | ICD-10-CM

## 2022-10-09 DIAGNOSIS — L732 Hidradenitis suppurativa: Secondary | ICD-10-CM | POA: Diagnosis not present

## 2022-10-09 DIAGNOSIS — Z5181 Encounter for therapeutic drug level monitoring: Secondary | ICD-10-CM | POA: Diagnosis not present

## 2022-10-24 ENCOUNTER — Ambulatory Visit
Admission: RE | Admit: 2022-10-24 | Discharge: 2022-10-24 | Disposition: A | Discharge status: Home / Self Care | Payer: BC Managed Care – PPO | Source: Ambulatory Visit | Attending: Internal Medicine | Admitting: Internal Medicine

## 2022-10-24 DIAGNOSIS — Z1231 Encounter for screening mammogram for malignant neoplasm of breast: Secondary | ICD-10-CM

## 2022-11-22 DIAGNOSIS — I10 Essential (primary) hypertension: Secondary | ICD-10-CM | POA: Diagnosis not present

## 2022-12-18 ENCOUNTER — Encounter: Payer: Self-pay | Admitting: Obstetrics & Gynecology

## 2022-12-24 ENCOUNTER — Encounter: Payer: Self-pay | Admitting: *Deleted

## 2022-12-26 NOTE — Progress Notes (Signed)
2 bottles of Myfembree given to patient.  Lot number [id number], exp 12/13/2023.

## 2023-01-08 ENCOUNTER — Encounter: Payer: Self-pay | Admitting: *Deleted

## 2023-01-08 ENCOUNTER — Telehealth: Payer: Self-pay | Admitting: *Deleted

## 2023-01-08 NOTE — Telephone Encounter (Signed)
PA was denied for Myfembree.  Pt is wanting to know what to do now. Given a couple weeks worth of samples.

## 2023-02-25 DIAGNOSIS — Z6841 Body Mass Index (BMI) 40.0 and over, adult: Secondary | ICD-10-CM | POA: Diagnosis not present

## 2023-02-25 DIAGNOSIS — Z0000 Encounter for general adult medical examination without abnormal findings: Secondary | ICD-10-CM | POA: Diagnosis not present

## 2023-02-25 DIAGNOSIS — M150 Primary generalized (osteo)arthritis: Secondary | ICD-10-CM | POA: Diagnosis not present

## 2023-02-25 DIAGNOSIS — I5031 Acute diastolic (congestive) heart failure: Secondary | ICD-10-CM | POA: Diagnosis not present

## 2023-02-25 DIAGNOSIS — I10 Essential (primary) hypertension: Secondary | ICD-10-CM | POA: Diagnosis not present

## 2023-02-25 DIAGNOSIS — N182 Chronic kidney disease, stage 2 (mild): Secondary | ICD-10-CM | POA: Diagnosis not present

## 2023-04-25 ENCOUNTER — Ambulatory Visit (INDEPENDENT_AMBULATORY_CARE_PROVIDER_SITE_OTHER): Payer: BC Managed Care – PPO | Admitting: Obstetrics & Gynecology

## 2023-04-25 ENCOUNTER — Other Ambulatory Visit (HOSPITAL_COMMUNITY)
Admission: RE | Admit: 2023-04-25 | Discharge: 2023-04-25 | Disposition: A | Discharge status: Home / Self Care | Source: Ambulatory Visit | Attending: Obstetrics & Gynecology | Admitting: Obstetrics & Gynecology

## 2023-04-25 ENCOUNTER — Encounter: Payer: Self-pay | Admitting: Obstetrics & Gynecology

## 2023-04-25 VITALS — BP 169/100 | HR 93 | Ht 68.0 in | Wt 306.0 lb

## 2023-04-25 DIAGNOSIS — D259 Leiomyoma of uterus, unspecified: Secondary | ICD-10-CM

## 2023-04-25 DIAGNOSIS — N920 Excessive and frequent menstruation with regular cycle: Secondary | ICD-10-CM | POA: Diagnosis not present

## 2023-04-25 DIAGNOSIS — Z01419 Encounter for gynecological examination (general) (routine) without abnormal findings: Secondary | ICD-10-CM | POA: Insufficient documentation

## 2023-04-25 DIAGNOSIS — N946 Dysmenorrhea, unspecified: Secondary | ICD-10-CM | POA: Diagnosis not present

## 2023-04-25 DIAGNOSIS — D219 Benign neoplasm of connective and other soft tissue, unspecified: Secondary | ICD-10-CM

## 2023-04-25 MED ORDER — MYFEMBREE 40-1-0.5 MG PO TABS: 1.0000 | ORAL_TABLET | Freq: Every day | ORAL | 11 refills | Status: AC

## 2023-04-25 NOTE — Progress Notes (Signed)
 Subjective:     Jeanne Guerrero is a 53 y.o. female here for a routine exam.  Patient's last menstrual period was 02/25/2023. [id number] Birth Control Method:  BTL + myfembree Menstrual Calendar(currently): amenorrheic on myfembree  Current complaints: none.   Current acute medical issues:  HTN   Recent Gynecologic History Patient's last menstrual period was 02/25/2023. Last Pap: 05/2020,  normal Last mammogram: 9/24,  normal  Past Medical History:  Diagnosis Date   Fibroids    Hypertension     Past Surgical History:  Procedure Laterality Date   CESAREAN SECTION     TUBAL LIGATION      OB History     Gravida  2   Para  2   Term  2   Preterm      AB      Living  2      SAB      IAB      Ectopic      Multiple      Live Births              Social History   Socioeconomic History   Marital status: Divorced    Spouse name: Not on file   Number of children: Not on file   Years of education: Not on file   Highest education level: Not on file  Occupational History   Not on file  Tobacco Use   Smoking status: Former    Types: Cigarettes   Smokeless tobacco: Never  Vaping Use   Vaping status: Never Used  Substance and Sexual Activity   Alcohol use: Not Currently   Drug use: Never   Sexual activity: Yes    Birth control/protection: Surgical    Comment: tubal  Other Topics Concern   Not on file  Social History Narrative   Not on file   Social Drivers of Health   Financial Resource Strain: Low Risk  (05/13/2020)   Overall Financial Resource Strain (CARDIA)    Difficulty of Paying Living Expenses: Not very hard  Food Insecurity: No Food Insecurity (05/13/2020)   Hunger Vital Sign    Worried About Running Out of Food in the Last Year: Never true    Ran Out of Food in the Last Year: Never true  Transportation Needs: No Transportation Needs (05/13/2020)   PRAPARE - Administrator, Civil Service (Medical): No    Lack of Transportation  (Non-Medical): No  Physical Activity: Insufficiently Active (05/13/2020)   Exercise Vital Sign    Days of Exercise per Week: 2 days    Minutes of Exercise per Session: 30 min  Stress: No Stress Concern Present (05/13/2020)   [employer] of Occupational Health - Occupational Stress Questionnaire    Feeling of Stress : Only a little  Social Connections: Socially Isolated (05/13/2020)   Social Connection and Isolation Panel [NHANES]    Frequency of Communication with Friends and Family: More than three times a week    Frequency of Social Gatherings with Friends and Family: Once a week    Attends Religious Services: Never    Database administrator or Organizations: No    Attends Engineer, structural: Never    Marital Status: Divorced    Family History  Problem Relation Age of Onset   Breast cancer Maternal Grandmother      Current Outpatient Medications:    furosemide (LASIX) 20 MG tablet, Take 20 mg by mouth., Disp: , Rfl:    HUMIRA  PEN-CD/UC/HS STARTER 80 MG/0.8ML PNKT, Inject into the skin., Disp: , Rfl:    losartan (COZAAR) 100 MG tablet, Take 100 mg by mouth daily., Disp: , Rfl:    olmesartan (BENICAR) 40 MG tablet, Take 40 mg by mouth daily., Disp: , Rfl:    Relugolix-Estradiol-Norethind (MYFEMBREE) 40-1-0.5 MG TABS, Take 1 tablet by mouth daily., Disp: 30 tablet, Rfl: 11   VITAMIN D, CHOLECALCIFEROL, PO, Take 5,000 Units by mouth., Disp: , Rfl:   Review of Systems  Review of Systems  Constitutional: Negative for fever, chills, weight loss, malaise/fatigue and diaphoresis.  HENT: Negative for hearing loss, ear pain, nosebleeds, congestion, sore throat, neck pain, tinnitus and ear discharge.   Eyes: Negative for blurred vision, double vision, photophobia, pain, discharge and redness.  Respiratory: Negative for cough, hemoptysis, sputum production, shortness of breath, wheezing and stridor.   Cardiovascular: Negative for chest pain, palpitations, orthopnea,  claudication, leg swelling and PND.  Gastrointestinal: negative for abdominal pain. Negative for heartburn, nausea, vomiting, diarrhea, constipation, blood in stool and melena.  Genitourinary: Negative for dysuria, urgency, frequency, hematuria and flank pain.  Musculoskeletal: Negative for myalgias, back pain, joint pain and falls.  Skin: Negative for itching and rash.  Neurological: Negative for dizziness, tingling, tremors, sensory change, speech change, focal weakness, seizures, loss of consciousness, weakness and headaches.  Endo/Heme/Allergies: Negative for environmental allergies and polydipsia. Does not bruise/bleed easily.  Psychiatric/Behavioral: Negative for depression, suicidal ideas, hallucinations, memory loss and substance abuse. The patient is not nervous/anxious and does not have insomnia.        Objective:  Blood pressure (!) 164/105, pulse 93, height 5\' 8"  (1.727 m), weight (!) 306 lb (138.8 kg), last menstrual period 02/25/2023.   Physical Exam  Vitals reviewed. Constitutional: She is oriented to person, place, and time. She appears well-developed and well-nourished.  HENT:  Head: Normocephalic and atraumatic.        Right Ear: External ear normal.  Left Ear: External ear normal.  Nose: Nose normal.  Mouth/Throat: Oropharynx is clear and moist.  Eyes: Conjunctivae and EOM are normal. Pupils are equal, round, and reactive to light. Right eye exhibits no discharge. Left eye exhibits no discharge. No scleral icterus.  Neck: Normal range of motion. Neck supple. No tracheal deviation present. No thyromegaly present.  Cardiovascular: Normal rate, regular rhythm, normal heart sounds and intact distal pulses.  Exam reveals no gallop and no friction rub.   No murmur heard. Respiratory: Effort normal and breath sounds normal. No respiratory distress. She has no wheezes. She has no rales. She exhibits no tenderness.  GI: Soft. Bowel sounds are normal. She exhibits no distension  and no mass. There is no tenderness. There is no rebound and no guarding.  Genitourinary:  Breasts no masses skin changes or nipple changes bilaterally      Vulva is normal without lesions Vagina is pink moist without discharge Cervix normal in appearance and pap is done Uterus is 26 weeks size with multiple fibroids palpated Adnexa is negative with normal sized ovaries   Musculoskeletal: Normal range of motion. She exhibits no edema and no tenderness.  Neurological: She is alert and oriented to person, place, and time. She has normal reflexes. She displays normal reflexes. No cranial nerve deficit. She exhibits normal muscle tone. Coordination normal.  Skin: Skin is warm and dry. No rash noted. No erythema. No pallor.  Psychiatric: She has a normal mood and affect. Her behavior is normal. Judgment and thought content normal.  Medications Ordered at today's visit: Meds ordered this encounter  Medications   Relugolix-Estradiol-Norethind (MYFEMBREE) 40-1-0.5 MG TABS    Sig: Take 1 tablet by mouth daily.    Dispense:  30 tablet    Refill:  11    Other orders placed at today's visit: No orders of the defined types were placed in this encounter.    ASSESSMENT + PLAN:    ICD-10-CM   1. Well woman exam with routine gynecological exam  Z01.419     2. Encounter for gynecological examination with Papanicolaou smear of cervix  Z01.419 Cytology - PAP( [place])    3. Menorrhagia with regular cycle  N92.0     4. Dysmenorrhea  N94.6     5. Fibroids, 26 weeks size, 1644 cc by sonogram  D21.9           No follow-ups on file.

## 2023-04-30 LAB — CYTOLOGY - PAP
Comment: NEGATIVE
Diagnosis: NEGATIVE
Diagnosis: REACTIVE
High risk HPV: NEGATIVE

## 2023-05-14 ENCOUNTER — Telehealth: Payer: Self-pay | Admitting: *Deleted

## 2023-05-14 NOTE — Telephone Encounter (Signed)
 Pt's insurance has denied covering Myfembree due to pt having had 24 months of therapy with Myfembree. JSY

## 2023-05-15 ENCOUNTER — Other Ambulatory Visit: Payer: Self-pay | Admitting: Obstetrics & Gynecology

## 2023-05-15 MED ORDER — MEGESTROL ACETATE 40 MG PO TABS: 40.0000 mg | ORAL_TABLET | Freq: Every day | ORAL | 11 refills | Status: AC

## 2023-05-15 NOTE — Telephone Encounter (Signed)
 Left message letting pt know Myfembree was not approved by insurance. Dr. Despina Hidden sent in Megace to try and keep blood from returning. JSY

## 2023-05-27 DIAGNOSIS — I5032 Chronic diastolic (congestive) heart failure: Secondary | ICD-10-CM | POA: Diagnosis not present

## 2023-05-27 DIAGNOSIS — N182 Chronic kidney disease, stage 2 (mild): Secondary | ICD-10-CM | POA: Diagnosis not present

## 2023-05-27 DIAGNOSIS — I10 Essential (primary) hypertension: Secondary | ICD-10-CM | POA: Diagnosis not present

## 2023-05-27 DIAGNOSIS — M150 Primary generalized (osteo)arthritis: Secondary | ICD-10-CM | POA: Diagnosis not present

## 2023-05-31 ENCOUNTER — Other Ambulatory Visit: Payer: Self-pay | Admitting: Internal Medicine

## 2023-05-31 DIAGNOSIS — M810 Age-related osteoporosis without current pathological fracture: Secondary | ICD-10-CM

## 2023-08-26 DIAGNOSIS — N182 Chronic kidney disease, stage 2 (mild): Secondary | ICD-10-CM | POA: Diagnosis not present

## 2023-08-26 DIAGNOSIS — I10 Essential (primary) hypertension: Secondary | ICD-10-CM | POA: Diagnosis not present

## 2023-08-26 DIAGNOSIS — M150 Primary generalized (osteo)arthritis: Secondary | ICD-10-CM | POA: Diagnosis not present

## 2023-08-26 DIAGNOSIS — I5032 Chronic diastolic (congestive) heart failure: Secondary | ICD-10-CM | POA: Diagnosis not present

## 2023-08-27 ENCOUNTER — Other Ambulatory Visit (HOSPITAL_COMMUNITY)
Admission: RE | Admit: 2023-08-27 | Discharge: 2023-08-27 | Disposition: A | Discharge status: Home / Self Care | Source: Ambulatory Visit | Attending: Internal Medicine | Admitting: Internal Medicine

## 2023-08-27 DIAGNOSIS — Z23 Encounter for immunization: Secondary | ICD-10-CM | POA: Insufficient documentation

## 2023-08-29 LAB — MISC LABCORP TEST (SEND OUT): Labcorp test code: 58495

## 2023-09-19 DIAGNOSIS — L732 Hidradenitis suppurativa: Secondary | ICD-10-CM | POA: Diagnosis not present

## 2023-09-19 DIAGNOSIS — Z5181 Encounter for therapeutic drug level monitoring: Secondary | ICD-10-CM | POA: Diagnosis not present

## 2023-10-04 ENCOUNTER — Other Ambulatory Visit: Payer: Self-pay | Admitting: Internal Medicine

## 2023-10-04 DIAGNOSIS — Z1231 Encounter for screening mammogram for malignant neoplasm of breast: Secondary | ICD-10-CM

## 2023-10-30 IMAGING — MG MM DIGITAL SCREENING BILAT W/ TOMO AND CAD
8 of 13 series · 8 of 37 positions shown · non-contrast
Comparison: Previous exam(s).

ACR Breast Density Category a: The breast tissue is almost entirely
fatty.

CLINICAL DATA: Screening.

EXAM:
DIGITAL SCREENING BILATERAL MAMMOGRAM WITH TOMOSYNTHESIS AND CAD
TECHNIQUE: Bilateral screening digital craniocaudal and mediolateral oblique
mammograms were obtained. Bilateral screening digital breast
tomosynthesis was performed. The images were evaluated with
computer-aided detection.

[L CC synth-2D]
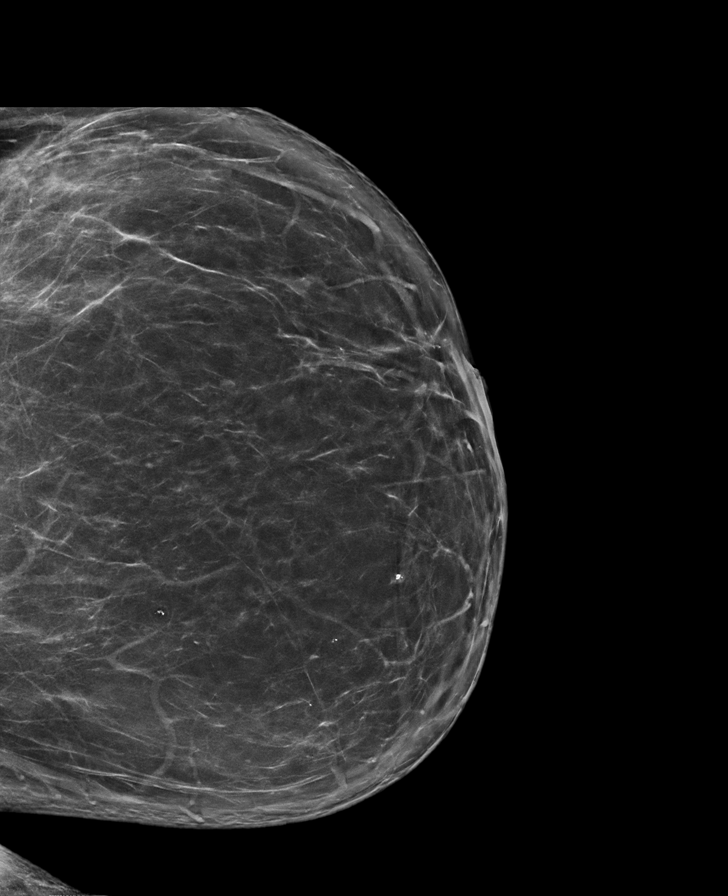

[R MLO synth-2D (1 of 3)]
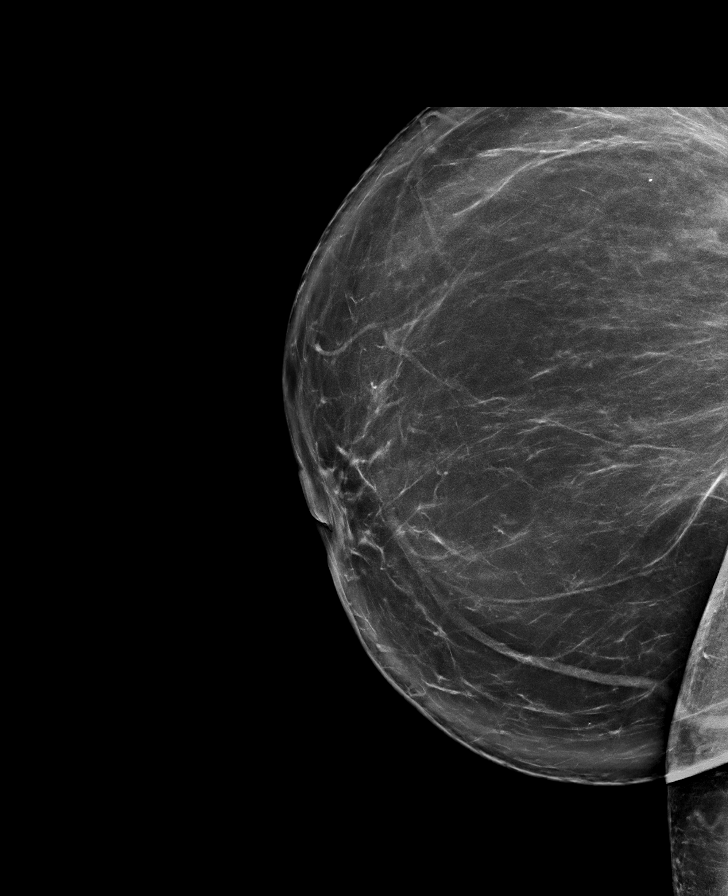

[L MLO synth-2D (1 of 2)]
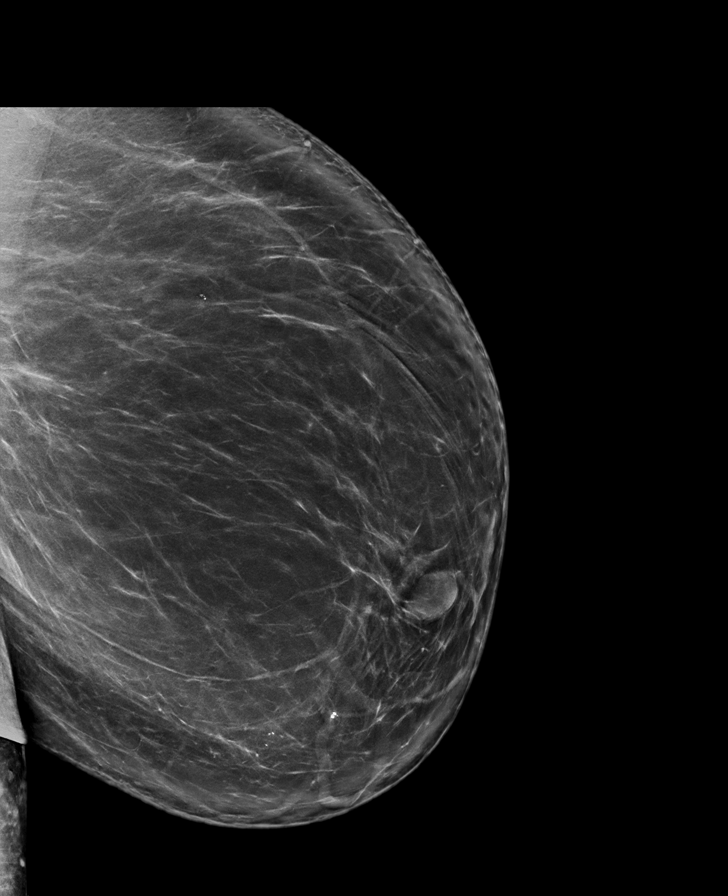

[R MLO synth-2D (2 of 3)]
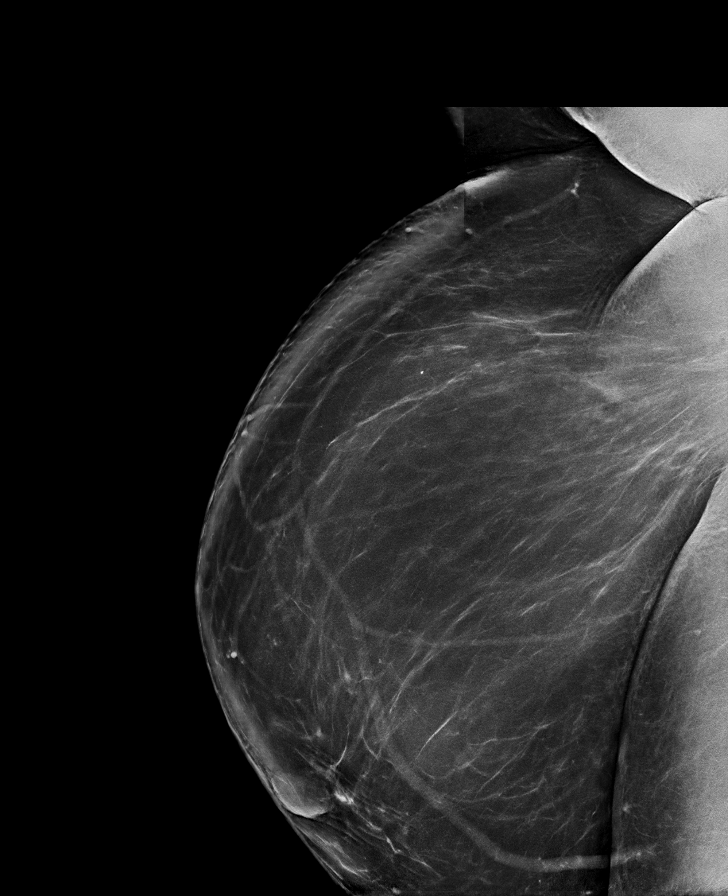

[L MLO synth-2D (2 of 2)]
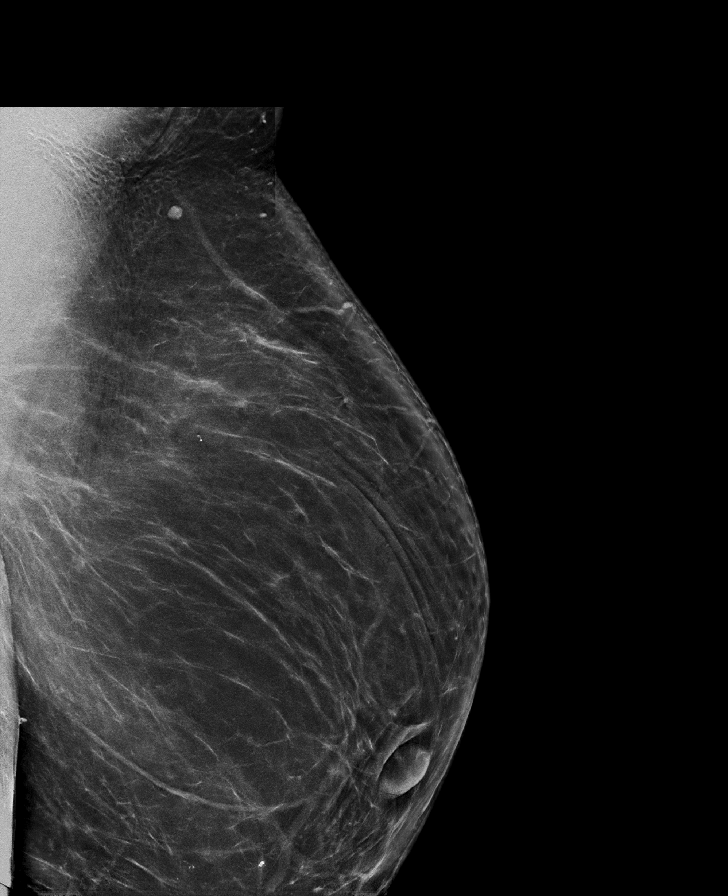

[R MLO synth-2D (3 of 3)]
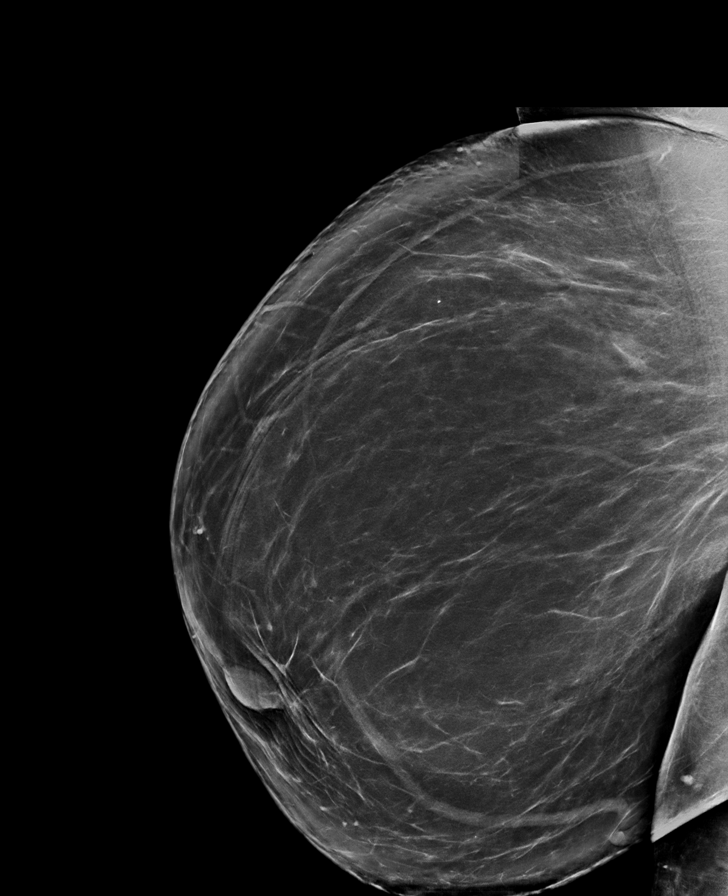

[R CC synth-2D]
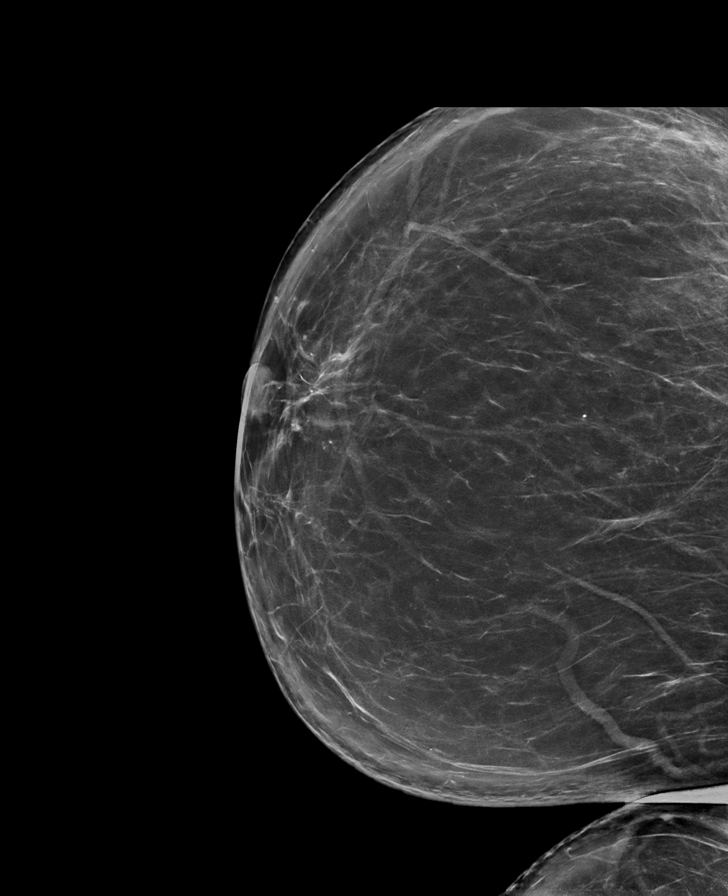

[R MLO tomo · tomo slice 55/108.0]
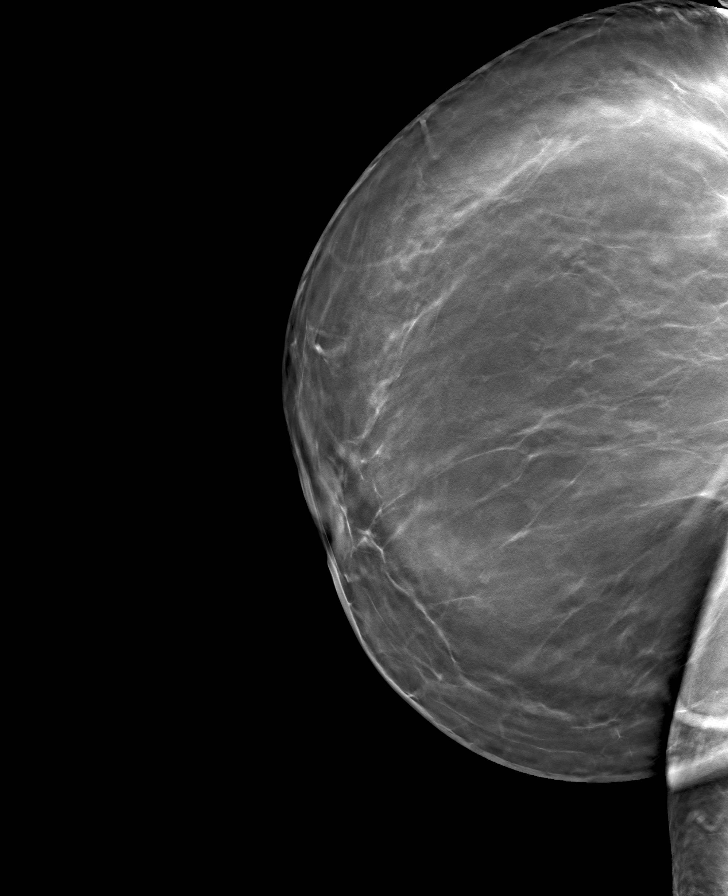

[8 of 37 positions shown; findings below may reference images not displayed]

FINDINGS: There are no findings suspicious for malignancy.
IMPRESSION: No mammographic evidence of malignancy. A result letter of this
screening mammogram will be mailed directly to the patient.

RECOMMENDATION:
Screening mammogram in one year. (Code:0E-3-N98)

BI-RADS CATEGORY  1: Negative.

## 2023-10-31 ENCOUNTER — Ambulatory Visit

## 2023-11-25 ENCOUNTER — Other Ambulatory Visit: Payer: Self-pay | Admitting: Internal Medicine

## 2023-11-25 DIAGNOSIS — Z1231 Encounter for screening mammogram for malignant neoplasm of breast: Secondary | ICD-10-CM

## 2023-12-02 DIAGNOSIS — N182 Chronic kidney disease, stage 2 (mild): Secondary | ICD-10-CM | POA: Diagnosis not present

## 2023-12-02 DIAGNOSIS — M150 Primary generalized (osteo)arthritis: Secondary | ICD-10-CM | POA: Diagnosis not present

## 2023-12-02 DIAGNOSIS — I10 Essential (primary) hypertension: Secondary | ICD-10-CM | POA: Diagnosis not present

## 2023-12-13 NOTE — Progress Notes (Signed)
 Jeanne Guerrero                                          MRN: [id number]   12/13/2023   The [facility] Quality Team Specialist reviewed this patient medical record for the purposes of chart review for care gap closure. The following were reviewed: chart review for care gap closure-controlling blood pressure.    [facility] Quality Team

## 2023-12-23 ENCOUNTER — Ambulatory Visit: Admission: RE | Admit: 2023-12-23 | Discharge: 2023-12-23 | Disposition: A | Source: Ambulatory Visit

## 2023-12-23 DIAGNOSIS — Z1231 Encounter for screening mammogram for malignant neoplasm of breast: Secondary | ICD-10-CM

## 2024-02-20 ENCOUNTER — Other Ambulatory Visit

## 2024-05-25 ENCOUNTER — Other Ambulatory Visit (HOSPITAL_BASED_OUTPATIENT_CLINIC_OR_DEPARTMENT_OTHER)
